# Patient Record
Sex: Female | Born: 1980 | Race: Black or African American | Hispanic: No | Marital: Single
Health system: Midwestern US, Academic
[De-identification: ages and names within clinical notes are randomized; demographics above are authoritative.]

---

## 2016-08-01 ENCOUNTER — Emergency Department: Admit: 2016-08-01 | Discharge: 2016-08-02 | Disposition: A | Discharge status: Home / Self Care | Payer: MEDICAID

## 2016-08-01 ENCOUNTER — Encounter: Admit: 2016-08-01 | Discharge: 2016-08-01 | Payer: MEDICAID

## 2016-08-01 ENCOUNTER — Emergency Department: Admit: 2016-08-01 | Discharge: 2016-08-01 | Payer: MEDICAID

## 2016-08-01 DIAGNOSIS — F329 Major depressive disorder, single episode, unspecified: Principal | ICD-10-CM

## 2016-08-01 DIAGNOSIS — O209 Hemorrhage in early pregnancy, unspecified: ICD-10-CM

## 2016-08-01 MED ORDER — METRONIDAZOLE 500 MG PO TAB
500 mg | ORAL_TABLET | Freq: Two times a day (BID) | ORAL | 0 refills | Status: AC
Start: 2016-08-01 — End: ?

## 2016-08-01 NOTE — ED Notes
Pt to [facility] with c/o vaginal bleeding. Pt stating that her last menstrual cycle was April 17th, pt stating that a week ago she had a positive pregnancy test. Pt stating she started spotting two days ago. Pt reports that she is still spotting, but bleeding has increased today. Pt is currently in NAD, A&Ox4, VSS.

## 2016-08-02 ENCOUNTER — Encounter: Admit: 2016-08-02 | Discharge: 2016-08-02 | Payer: MEDICAID

## 2016-08-02 DIAGNOSIS — O200 Threatened abortion: Principal | ICD-10-CM

## 2016-08-02 DIAGNOSIS — Z3A01 Less than 8 weeks gestation of pregnancy: ICD-10-CM

## 2016-08-02 DIAGNOSIS — O09521 Supervision of elderly multigravida, first trimester: ICD-10-CM

## 2016-08-02 LAB — URINALYSIS, MICROSCOPIC

## 2016-08-02 LAB — CHLAM/NG PCR SWAB
Lab: NEGATIVE
Lab: NEGATIVE

## 2016-08-02 LAB — URINALYSIS DIPSTICK
Lab: NEGATIVE
Lab: NEGATIVE

## 2016-08-02 LAB — DIRECT EXAM (WET PREP)

## 2016-08-02 LAB — BETA-HCG: Lab: 464 U/L — ABNORMAL HIGH (ref <5)

## 2016-08-02 NOTE — Telephone Encounter
Received the following email:     From: Judithe Modestindy Hill   Sent: Tuesday, August 01, 2016 11:17 PM  To: [email or web address]  Subject: New Patient    I am a new NP in the ED working with Jacki ConesLaurie. I have a 36yo G2P0A1.  MR # [id number]  Beta HCG [id number]  US showed viable intrauterine pregnancy with fetal pole too small for dates calculation.   She is to follow-up with you on Friday.   Thanks,  [facility]    Spoke with pt. Pt stated that she is still having bright red bleeding. No cramping. Pt is using a panty liner. LMP 06-06-16. Scheduled in [facility] for June 15,2018 at 9:30 am. Routed to Dr JamaicaFrench

## 2016-08-02 NOTE — Telephone Encounter
Per Dr Janice NorrieFrench, [facility] 815 am [facility].    Pt was notified.

## 2016-08-02 NOTE — ED Notes
Discharge instructions discussed by NP. Patient discharges, ambulatory and stable, accompanied by family.

## 2016-08-02 NOTE — ED Provider Notes
Kayla Fritz is a 36 y.o. female.    Chief Complaint:  Chief Complaint   Patient presents with   ??? Vaginal Bleeding       History of Present Illness:  Kayla Fritz is a 36 y.o. G29P0A1 female without pertinent medical history presenting to the emergency department for vaginal bleeding. Patient reports positive HPT 1 week ago with LMP 4/17. She indicates increasing nausea x 1 week with light spotting yesterday that has been increasing. She denies any associated abdominal pain.        History provided by:  Patient  Language interpreter used: No        Review of Systems:  Review of Systems   Constitutional: Negative for chills and fever.   HENT: Negative.    Respiratory: Negative for shortness of breath.    Cardiovascular: Negative for chest pain.   Gastrointestinal: Positive for nausea. Negative for abdominal pain, constipation, diarrhea and vomiting.   Genitourinary: Positive for vaginal bleeding. Negative for dysuria, frequency, hematuria, menstrual problem, pelvic pain, urgency and vaginal discharge.   Musculoskeletal: Negative.    Neurological: Negative.  Negative for syncope, weakness and light-headedness.       Allergies:  Patient has no known allergies.    Past Medical History:  Past Medical History:   Diagnosis Date   ??? Depression        Past Surgical History:  History reviewed. No pertinent surgical history.    Pertinent medical/surgical history reviewed  Past Medical History:   Diagnosis Date   ??? Depression      History reviewed. No pertinent surgical history.    Social History:  Social History   Substance Use Topics   ??? Smoking status: Never Smoker   ??? Smokeless tobacco: Never Used   ??? Alcohol use 0.0 oz/week      Comment: occasionally     History   Drug Use No       Family History:  Family History   Problem Relation Age of Onset   ??? Hypertension Mother    ??? Diabetes Mother    ??? Hypertension Father    ??? Diabetes Father    ??? Cancer Father        Vitals:  ED Vitals Date and Time T BP P RR SPO2P SPO2 User   08/01/16 2334 -- 149/83 -- -- -- -- DL   96/04/54 0981 -- -- -- -- 92 100 % DL   19/14/78 2956 -- 213/08 -- -- 69 100 % AW   08/01/16 2117 -- -- -- -- 66 99 % AW   08/01/16 2115 -- 122/71 -- -- -- -- AW          Physical Exam:  Physical Exam   Constitutional: She is oriented to person, place, and time. No distress.   Eyes: Conjunctivae are normal.   Neck: Neck supple.   Cardiovascular: Normal rate, regular rhythm, normal heart sounds and intact distal pulses.  Exam reveals no friction rub.    No murmur heard.  Pulmonary/Chest: Effort normal and breath sounds normal. No respiratory distress. She has no wheezes.   Abdominal: Soft. Bowel sounds are normal. She exhibits no distension. There is no tenderness.   Genitourinary: Cervix exhibits no motion tenderness and no friability. Right adnexum displays no tenderness. Left adnexum displays tenderness. There is bleeding in the vagina.   Genitourinary Comments: External genitalia NL, cervical OS difficult to evaluate due to bloody discharge.   Musculoskeletal: Normal range of motion.  Neurological: She is alert and oriented to person, place, and time.   Skin: Skin is warm and dry. She is not diaphoretic.   Psychiatric: She has a normal mood and affect.   Nursing note and vitals reviewed.      Laboratory Results:  Results for orders placed or performed during the hospital encounter of 08/01/16 (from the past 24 hour(s))   URINALYSIS DIPSTICK   Result Value Ref Range    Color,UA STRAW     Turbidity,UA CLEAR CLEAR-CLEAR    Specific Gravity-Urine 1.004 1.003 - 1.035    pH,UA 6.0 5.0 - 8.0    Protein,UA NEG NEG-NEG    Glucose,UA NEG NEG-NEG    Ketones,UA TRACE (A) NEG-NEG    Bilirubin,UA NEG NEG-NEG    Blood,UA 2+ (A) NEG-NEG    Urobilinogen,UA NORMAL NORM-NORMAL    Nitrite,UA NEG NEG-NEG    Leukocytes,UA TRACE (A) NEG-NEG    Urine Ascorbic Acid, UA NEG NEG-NEG   URINALYSIS, MICROSCOPIC   Result Value Ref Range WBCs,UA 0-2 0 - 2 /HPF    RBCs,UA 0-2 0 - 3 /HPF    Squamous Epithelial Cells 0-2 0 - 5   BETA-HCG   Result Value Ref Range    Beta-HCG,Serum 46,428 (H) <5 U/L   RH IMMUNE GLOBULIN EVALUATION (TYPE & RH)   Result Value Ref Range    ABO/RH(D) O POS    DIRECT EXAM (WET PREP)   Result Value Ref Range    Battery Name DIRECT EXAM,WET PREP     Specimen Description CERVICAL     Special Requests NONE     Direct Exam       MANY  CLUE CELLS  NO YEAST SEEN  NO TRICHOMONAS SEEN  DRY SWAB RECEIVED, MAY AFFECT RESULTS      Report Status FINAL  08/01/2016        Urine Pregnancy  Urine Pregnancy: Positive  QC: Acceptable  Urine Pregnancy Lot #: [id number]    Radiology Interpretation:  US OB LT 14 WKS TRANSAB 1ST GEST   Final Result      1. Single viable intrauterine pregnancy with estimated gestational age of [redacted] weeks 6 days based on gestational sac diameter. A tiny fetal pole with heart tones is noted, too small for dates calculation. If clinically indicated, follow-up ultrasound in 10-14 days recommended for reevaluation.   2. Several small subchorionic hematomas surrounding the gestational sac measuring up to 1.2 cm.      By my electronic signature, I attest that I have personally reviewed the images for this examination and formulated the interpretations and opinions expressed in this report          Finalized by Jason Coop, M.D. on 08/01/2016 11:06 PM. Dictated by Ranee Gosselin, M.D. on 08/01/2016 10:28 PM.         US OB TRANSVAGINAL   Final Result      1. Single viable intrauterine pregnancy with estimated gestational age of [redacted] weeks 6 days based on gestational sac diameter. A tiny fetal pole with heart tones is noted, too small for dates calculation. If clinically indicated, follow-up ultrasound in 10-14 days recommended for reevaluation.   2. Several small subchorionic hematomas surrounding the gestational sac measuring up to 1.2 cm.      By my electronic signature, I attest that I have personally reviewed the images for this examination and formulated the interpretations and opinions expressed in this report          Finalized by Jason Coop,  M.D. on 08/01/2016 11:06 PM. Dictated by Ranee Gosselin, M.D. on 08/01/2016 10:28 PM.           US OB LT 14 WKS TRANSAB 1ST GEST   Final Result      1. Single viable intrauterine pregnancy with estimated gestational age of [redacted] weeks 6 days based on gestational sac diameter. A tiny fetal pole with heart tones is noted, too small for dates calculation. If clinically indicated, follow-up ultrasound in 10-14 days recommended for reevaluation.   2. Several small subchorionic hematomas surrounding the gestational sac measuring up to 1.2 cm.      By my electronic signature, I attest that I have personally reviewed the images for this examination and formulated the interpretations and opinions expressed in this report          Finalized by Jason Coop, M.D. on 08/01/2016 11:06 PM. Dictated by Ranee Gosselin, M.D. on 08/01/2016 10:28 PM.         US OB TRANSVAGINAL   Final Result      1. Single viable intrauterine pregnancy with estimated gestational age of [redacted] weeks 6 days based on gestational sac diameter. A tiny fetal pole with heart tones is noted, too small for dates calculation. If clinically indicated, follow-up ultrasound in 10-14 days recommended for reevaluation.   2. Several small subchorionic hematomas surrounding the gestational sac measuring up to 1.2 cm.      By my electronic signature, I attest that I have personally reviewed the images for this examination and formulated the interpretations and opinions expressed in this report          Finalized by Jason Coop, M.D. on 08/01/2016 11:06 PM. Dictated by Ranee Gosselin, M.D. on 08/01/2016 10:28 PM.           EKG:  None    ED Course:  The patient presented with a history of positive home pregnancy test one week prior and a two day history of light vaginal bleeding which has been worsening throughout the day. Pelvic performed, moderate amount of blood. Labs sent with type Rh and Beta HCG.  Beta HCG > 46,000, U/S demonstrated a viable intrauterine pregnancy with fetal pole smaller than dates calculation, and several small subchorionic hematomas. Vaginal cultures positive for many clue cells.  Sent email for follow-up with the [facility] , prescription for Metronidazole provided for BV, and emergency precautions given.  The patient verbalized understanding of the plan and all questions answered.        MDM  Reviewed: previous chart, nursing note and vitals  Interpretation: labs and ultrasound        Facility Administered Meds:  No current facility-administered medications on file as of 08/01/2016.          Clinical Impression:  Final diagnoses:   Threatened miscarriage in early pregnancy (Primary)       Disposition/Follow up  Discharge    [facility] [place], OBGYN  [address]  [place] [address]  [phone]    Schedule an appointment as soon as possible for a visit in 3 days  [facility] will contact you at [phone] to schedule your follow-up for this Friday.       Medications:  Discharge Medication List as of 08/01/2016 11:21 PM          Procedure Notes:  Procedures      Attestation / Supervision:  Maretta Bees, am scribing for and in the presence of Sherlean Foot, APRN.    Attestation /  Supervision Note concerning Shon Baton: The service was provided by the ARNP alone with immediate availability of a physician in the ED. and I, Sherlean Foot, APRN, personally performed the services described in this documentation as scribed in my presence and it is both accurate and complete.    Sherlean Foot, APRN

## 2016-08-04 ENCOUNTER — Ambulatory Visit: Admit: 2016-08-04 | Discharge: 2016-08-04 | Payer: MEDICAID

## 2016-08-04 ENCOUNTER — Encounter: Admit: 2016-08-04 | Discharge: 2016-08-04 | Payer: MEDICAID

## 2016-08-04 ENCOUNTER — Ambulatory Visit: Admit: 2016-08-04 | Discharge: 2016-08-05 | Payer: MEDICAID

## 2016-08-04 DIAGNOSIS — Z3687 Encounter for antenatal screening for uncertain dates: Principal | ICD-10-CM

## 2016-08-04 DIAGNOSIS — O200 Threatened abortion: Principal | ICD-10-CM

## 2016-08-04 DIAGNOSIS — F329 Major depressive disorder, single episode, unspecified: Principal | ICD-10-CM

## 2016-08-04 NOTE — Progress Notes
Attending Attestations:  I saw and evaluated this patient and I reviewed and discussed the case of this patient with the resident physician, Dr. Lafayette Dragonarr, including the resident's findings in the history, physical examination, diagnosis and treatment plan for today's visit.      36 y.o. G2P0010 at 4877w1d with a viable IUP.  Desired pregnancy, seeking prenatal care.  Plans to move to [place] and will get [place] Medicaid.  Recommended establishing care within the next 4 weeks. Offered PNV, patient declines.  Encouraged foods high in folic acid.  Blood type O POS.  Support given.      Myrtie HawkValerie A Cabella Kimm, MD

## 2016-08-04 NOTE — Progress Notes
Early Pregnancy Clinic      HISTORY & PHYSICAL    CC: Kayla Fritz is a 36 y.o. G68P0010 female here for initial evaluation. She presents for management of a threatened miscarriage.     HPI:  Pt presented to the [place] ED on 6/12 with vaginal bleeding. Ultrasound with gestational sac c/w [redacted]w[redacted]d and fetal pole with cardiac activity and subchorionic bleed was noted. BHCG was 46,428 at this time.   Today, patient states that she is continuing to have some vaginal spotting but states it is not heavy. This is a desired pregnancy. She and her partner are thinking of moving to [place] and plan to apply for medicaid for the pregnancy.     One prior TAB.        PMH/PSH: Patient's allergies, medications, past medical, surgical, family and social histories were reviewed and updated as appropriate.  Past Medical History:   Diagnosis Date   ??? Depression      Past Surgical History:   Procedure Laterality Date   ??? ANKLE SURGERY Right    ??? TONSILLECTOMY       No Known Allergies  Current Outpatient Prescriptions on File Prior to Visit   Medication Sig Dispense Refill   ??? buPROPion XL (WELLBUTRIN XL) 300 mg tablet Take 300 mg by mouth every morning.     ??? cyclobenzaprine (FLEXERIL) 5 mg tablet Take 1 Tab by mouth three times daily. Prn neck pain 20 Tab 1   ??? ethynodiol diac/ethinyl estradiol 1/35 (ZOVIA-28, KELNOR-28, DEMULEN-28) 1 mg/35 mcg tablet Take 1 tablet by mouth daily. 3 packet 4   ??? metroNIDAZOLE (FLAGYL) 500 mg tablet Take 1 tablet by mouth twice daily. Take with food. Do not drink alcohol while on metronidazole. 14 tablet 0     No current facility-administered medications on file prior to visit.      Family History   Problem Relation Age of Onset   ??? Hypertension Mother    ??? Diabetes Mother    ??? Hypertension Father    ??? Diabetes Father    ??? Cancer Father    ??? Diabetes Maternal Grandmother    ??? Heart Disease Paternal Grandmother    ??? Hypertension Paternal Grandmother    ??? Diabetes Paternal Grandfather ??? Cancer-Breast Neg Hx    ??? Cervical Cancer Neg Hx    ??? Cancer-Colon Neg Hx    ??? Cancer-Ovarian Neg Hx    ??? Cancer-Uterine Neg Hx         Ob & Menstrual History:   Patient's last menstrual period was 06/06/2016.        Social History     Social History Narrative   ??? No narrative on file     Has emotional/social support for pregnancy loss: No  Screen for IPV: negative  Social History   Substance Use Topics   ??? Smoking status: Never Smoker   ??? Smokeless tobacco: Never Used   ??? Alcohol use 0.0 oz/week      Comment: occasionally       Review of Systems  Gastrointestinal: nausea: Yes; vomiting:No  Genitourinary: abnormal vaginal bleeding:Yes    Objective:  BP 120/62  - Pulse 80  - Ht 167.6 cm (66)  - Wt 105 kg (231 lb 6.4 oz)  - LMP 06/06/2016  - BMI 37.35 kg/m???     General appearance: alert, appears stated age and cooperative  HEENT: normocephalic, atraumatic, EOMIs grossly intact  Thyroid: normal in size, no nodules, nontender  CV: regular  rate and rhythm, no murmurs/rubs/gallops, distal pulses intact  Resp: Clear to auscultation bilaterally, no wheezes/rales/rhonchi  Abdomen: soft, non-tender; no masses  Pelvic: deferred  Skin: warm and dry  Neurological: She is alert. No gross localizing deficits.  Psychiatric: She has a normal mood and affect. Her behavior is normal. Judgment and thought content normal. Appears anxious     Blood type: O POS   Lab Results   Component Value Date    HCT 40.7 11/03/2015     Other labs:      Ref. Range 08/01/2016 19:43   Beta-HCG,Serum Latest Ref Range: <5 U/L 46,428 (H)     TVUS 08/01/16:   Single viable intrauterine pregnancy with estimated gestational age of   [redacted] weeks 6 days based on gestational sac diameter. A tiny fetal pole with   heart tones is noted, too small for dates calculation. If clinically   indicated, follow-up ultrasound in 10-14 days recommended for   reevaluation.  2. Several small subchorionic hematomas surrounding the gestational sac   measuring up to 1.2 cm. [facility] sono today:  6 week viable IUP not c/w LMP with cardiac activity seen - FHT 166     Assessment/Plan:  36 y.o. G2P0010 with threatened abortion with viable IUP     (1) Threatened miscarriage.  This is a desired pregnancy.  Reviewed causes, incidence and prognosis of bleeding in pregnancy.  Reviewed the increased risk in pregnancy with a subchoronic hematoma including increased risk of miscarriage, abruption, preterm delivery, and preterm premature rupture of membranes. Pt given bleeding precautions.     (2) The patient desires prenatal care. We discussed prenatal vitamins and prenatal care.  Patient plans to obtain prenatal care. Discussed obtaining care within the next 4 weeks.       The patient was evaluated with Dr. Tami Ribas, MD  Kayla Kirks, MD

## 2016-08-08 NOTE — Progress Notes
Kayla Fritz presents for an ultrasound encounter. Past Medical, Surgical, Family & Social History; Medications & Allergies contained in the electronic record below were not reviewed today and may not be up-to-date. Please see [facility]BG[facility] report for all documentation related to this encounter.    08/08/2016  Kayla CraftsVanessa Tyreke Kaeser, LPN
